# Patient Record
Sex: Female | Born: 1970 | Race: Black or African American | Hispanic: No | Marital: Single | Smoking: Never smoker
Health system: Southern US, Community
[De-identification: ages and names within clinical notes are randomized; demographics above are authoritative.]

## PROBLEM LIST (undated history)

## (undated) DIAGNOSIS — L80 Vitiligo: Secondary | ICD-10-CM

## (undated) DIAGNOSIS — D497 Neoplasm of unspecified behavior of endocrine glands and other parts of nervous system: Secondary | ICD-10-CM

## (undated) DIAGNOSIS — E237 Disorder of pituitary gland, unspecified: Secondary | ICD-10-CM

---

## 2018-07-04 ENCOUNTER — Other Ambulatory Visit: Payer: Self-pay

## 2018-07-04 ENCOUNTER — Emergency Department: Payer: Medicaid Other

## 2018-07-04 ENCOUNTER — Encounter: Payer: Self-pay | Admitting: Emergency Medicine

## 2018-07-04 DIAGNOSIS — R911 Solitary pulmonary nodule: Secondary | ICD-10-CM | POA: Insufficient documentation

## 2018-07-04 DIAGNOSIS — M546 Pain in thoracic spine: Secondary | ICD-10-CM | POA: Diagnosis not present

## 2018-07-04 DIAGNOSIS — R079 Chest pain, unspecified: Secondary | ICD-10-CM | POA: Diagnosis not present

## 2018-07-04 LAB — COMPREHENSIVE METABOLIC PANEL
ALT: 15 U/L (ref 0–44)
AST: 15 U/L (ref 15–41)
Albumin: 4 g/dL (ref 3.5–5.0)
Alkaline Phosphatase: 55 U/L (ref 38–126)
Anion gap: 5 (ref 5–15)
BILIRUBIN TOTAL: 0.6 mg/dL (ref 0.3–1.2)
BUN: 19 mg/dL (ref 6–20)
CO2: 29 mmol/L (ref 22–32)
Calcium: 9.4 mg/dL (ref 8.9–10.3)
Chloride: 107 mmol/L (ref 98–111)
Creatinine, Ser: 0.77 mg/dL (ref 0.44–1.00)
GFR calc Af Amer: >60 mL/min (ref >60)
GFR calc non Af Amer: >60 mL/min (ref >60)
Glucose, Bld: 134 mg/dL — ABNORMAL HIGH (ref 70–99)
Potassium: 3.8 mmol/L (ref 3.5–5.1)
Sodium: 141 mmol/L (ref 135–145)
Total Protein: 7.6 g/dL (ref 6.5–8.1)

## 2018-07-04 LAB — CBC
HCT: 36 % (ref 36.0–46.0)
Hemoglobin: 11.6 g/dL — ABNORMAL LOW (ref 12.0–15.0)
MCH: 29.4 pg (ref 26.0–34.0)
MCHC: 32.2 g/dL (ref 30.0–36.0)
MCV: 91.1 fL (ref 80.0–100.0)
Platelets: 249 10*3/uL (ref 150–400)
RBC: 3.95 MIL/uL (ref 3.87–5.11)
RDW: 11.9 % (ref 11.5–15.5)
WBC: 4.5 10*3/uL (ref 4.0–10.5)
nRBC: 0 % (ref 0.0–0.2)

## 2018-07-04 LAB — TROPONIN I: Troponin I: <0.03 ng/mL (ref <0.03)

## 2018-07-04 LAB — POCT PREGNANCY, URINE: PREG TEST UR: NEGATIVE

## 2018-07-04 NOTE — ED Triage Notes (Signed)
Patient with complaint of intermittent chest pain radiating to her back with shortness of breath for a year. Patient states that the pain is happening more frequently.

## 2018-07-05 ENCOUNTER — Emergency Department: Payer: Medicaid Other

## 2018-07-05 ENCOUNTER — Encounter: Payer: Self-pay | Admitting: Radiology

## 2018-07-05 ENCOUNTER — Emergency Department
Admission: EM | Admit: 2018-07-05 | Discharge: 2018-07-05 | Disposition: A | Discharge status: Home / Self Care | Payer: Medicaid Other | Attending: Emergency Medicine | Admitting: Emergency Medicine

## 2018-07-05 DIAGNOSIS — R079 Chest pain, unspecified: Secondary | ICD-10-CM

## 2018-07-05 DIAGNOSIS — R911 Solitary pulmonary nodule: Secondary | ICD-10-CM

## 2018-07-05 DIAGNOSIS — M546 Pain in thoracic spine: Secondary | ICD-10-CM

## 2018-07-05 HISTORY — DX: Disorder of pituitary gland, unspecified: E23.7

## 2018-07-05 LAB — FIBRIN DERIVATIVES D-DIMER (ARMC ONLY): Fibrin derivatives D-dimer (ARMC): 526.3 ng/mL (FEU) — ABNORMAL HIGH (ref 0.00–499.00)

## 2018-07-05 LAB — TROPONIN I: Troponin I: <0.03 ng/mL (ref <0.03)

## 2018-07-05 MED ORDER — IOHEXOL 350 MG/ML SOLN
75.0000 mL | Freq: Once | INTRAVENOUS | Status: AC | PRN
  Administered 2018-07-05: 75 mL via INTRAVENOUS

## 2018-07-05 MED ORDER — CYCLOBENZAPRINE HCL 5 MG PO TABS: ORAL_TABLET | ORAL | 0 refills | Status: DC

## 2018-07-05 MED ORDER — SODIUM CHLORIDE 0.9 % IV BOLUS
500.0000 mL | Freq: Once | INTRAVENOUS | Status: AC
  Administered 2018-07-05: 500 mL via INTRAVENOUS

## 2018-07-05 NOTE — Discharge Instructions (Addendum)
You may take muscle relaxer as needed.  Return to the ER for worsening symptoms, persistent vomiting, difficulty breathing or other concerns.

## 2018-07-05 NOTE — ED Provider Notes (Addendum)
[facility] Emergency Department Provider Note   ____________________________________________   First MD Initiated Contact with Patient 07/05/18 0403     (approximate)  I have reviewed the triage vital signs and the nursing notes.   HISTORY  Chief Complaint Chest Pain and Shortness of Breath    HPI Marilyn Flores is a 48 y.o. female who presents to the ED from home with a chief complaint of intermittent chest pain radiating to her back for the past YEAR.  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  States the pain is happening more frequently now and sometimes associated with shortness of breath.  Denies associated diaphoresis, palpitations, nausea/vomiting, dizziness.  Has not sought evaluation for it.  Denies recent fever, chills, cough, congestion, dysuria, diarrhea.  Denies recent travel, trauma or hormone use.    Past Medical History:  Diagnosis Date  . Pituitary abnormality (HCC)     There are no active problems to display for this patient.   History reviewed. No pertinent surgical history.  Prior to Admission medications   Not on File    Allergies Patient has no known allergies.  Family history None for CAD  Social History Social History   Tobacco Use  . Smoking status: Never Smoker  . Smokeless tobacco: Never Used  Substance Use Topics  . Alcohol use: Not on file  . Drug use: Not on file    Review of Systems  Constitutional: No fever/chills Eyes: No visual changes. ENT: No sore throat. Cardiovascular: Positive for chest pain. Respiratory: Denies shortness of breath. Gastrointestinal: No abdominal pain.  No nausea, no vomiting.  No diarrhea.  No constipation. Genitourinary: Negative for dysuria. Musculoskeletal: Positive for back pain. Skin: Negative for rash. Neurological: Negative for headaches, focal weakness or  numbness.   ____________________________________________   PHYSICAL EXAM:  VITAL SIGNS: ED Triage Vitals  Enc Vitals Group     BP 07/04/18 2202 126/86     Pulse Rate 07/04/18 2201 73     Resp 07/04/18 2201 18     Temp 07/04/18 2201 98.4 F (36.9 C)     Temp src --      SpO2 07/04/18 2201 98 %     Weight 07/04/18 2201 217 lb (98.4 kg)     Height 07/04/18 2201 5\' 9"  (1.753 m)     Head Circumference --      Peak Flow --      Pain Score 07/04/18 2201 7     Pain Loc --      Pain Edu? --      Excl. in [place]? --     Constitutional: Alert and oriented. Well appearing and in no acute distress. Eyes: Conjunctivae are normal. PERRL. EOMI. Head: Atraumatic. Nose: No congestion/rhinnorhea. Mouth/Throat: Mucous membranes are moist.  Oropharynx non-erythematous. Neck: No stridor.   Cardiovascular: Normal rate, regular rhythm. Grossly normal heart sounds.  Good peripheral circulation. Respiratory: Normal respiratory effort.  No retractions. Lungs CTAB.  Anterior chest tender to palpation. Gastrointestinal: Soft and nontender. No distention. No abdominal bruits. No CVA tenderness. Musculoskeletal: Thoracic back tender to palpation.  No lower extremity tenderness nor edema.  No joint effusions. Neurologic:  Normal speech and language. No gross focal neurologic deficits are appreciated. No gait instability. Skin:  Skin is warm, dry and intact. No rash noted.  Vitiligo. Psychiatric: Mood and affect are normal. Speech and behavior are normal.  ____________________________________________  LABS (all labs ordered are listed, but only abnormal results are displayed)  Labs Reviewed  CBC - Abnormal; Notable for the following components:      Result Value   Hemoglobin 11.6 (*)    All other components within normal limits  COMPREHENSIVE METABOLIC PANEL - Abnormal; Notable for the following components:   Glucose, Bld 134 (*)    All other components within normal limits  FIBRIN DERIVATIVES  D-DIMER ([facility] ONLY) - Abnormal; Notable for the following components:   Fibrin derivatives D-dimer ([facility]) 526.30 (*)    All other components within normal limits  TROPONIN I  TROPONIN I  POC URINE PREG, ED  POCT PREGNANCY, URINE   ____________________________________________  EKG  ED ECG REPORT I, Maja Mccaffery J, the attending physician, personally viewed and interpreted this ECG.   Date: 07/05/2018  EKG Time: 2207  Rate: 75  Rhythm: normal EKG, normal sinus rhythm  Axis: Normal  Intervals:none  ST&T Change: Nonspecific  ____________________________________________  RADIOLOGY  ED MD interpretation: No acute cardiopulmonary process  Official radiology report(s): Dg Chest 2 View  Result Date: 07/04/2018 CLINICAL DATA:  Chest pain. EXAM: CHEST - 2 VIEW COMPARISON:  None. FINDINGS: Cardiomediastinal silhouette is normal. No pleural effusions or focal consolidations. Trachea projects midline and there is no pneumothorax. Soft tissue planes and included osseous structures are non-suspicious. IMPRESSION: Normal chest. Electronically Signed   By: Elon Alas M.D.   On: 07/04/2018 22:38   Ct Angio Chest Pe W/cm &/or Wo Cm  Result Date: 07/05/2018 CLINICAL DATA:  Shortness of breath and chest pain EXAM: CT ANGIOGRAPHY CHEST WITH CONTRAST TECHNIQUE: Multidetector CT imaging of the chest was performed using the standard protocol during bolus administration of intravenous contrast. Multiplanar CT image reconstructions and MIPs were obtained to evaluate the vascular anatomy. CONTRAST:  70mL OMNIPAQUE IOHEXOL 350 MG/ML SOLN COMPARISON:  Chest radiograph July 04, 2018 FINDINGS: Cardiovascular: There is no demonstrable pulmonary embolus. There is no thoracic aortic aneurysm or dissection. Visualized great vessels appear unremarkable. No pericardial effusion or pericardial thickening evident. Mediastinum/Nodes: Visualized thyroid appears normal. There is no appreciable thoracic adenopathy.  There is no esophageal lesion evident. Lungs/Pleura: There is atelectatic change in the lower lung regions. There is no edema or consolidation. There is no appreciable pleural effusion or pleural thickening. On axial slice 38 series 6, there is a 3 mm nodular opacity in the anterior segment of the right upper lobe. Upper Abdomen: Visualized upper abdominal structures appear unremarkable. Musculoskeletal: There are no blastic or lytic bone lesions. No evident chest wall lesions. Review of the MIP images confirms the above findings. IMPRESSION: 1. No demonstrable pulmonary embolus. No thoracic aortic aneurysm or dissection. 2. Areas of atelectatic change bilaterally. No edema or consolidation. 3. 3 mm nodular opacity in the anterior segment right upper lobe. No follow-up needed if patient is low-risk. Non-contrast chest CT can be considered in 12 months if patient is high-risk. This recommendation follows the consensus statement: Guidelines for Management of Incidental Pulmonary Nodules Detected on CT Images: From the Fleischner Society 2017; Radiology 2017; 284:228-243. 4.  No evident thoracic adenopathy. Electronically Signed   By: Lowella Grip III M.D.   On: 07/05/2018 07:08    ____________________________________________   PROCEDURES  Procedure(s) performed: None  Procedures  Critical Care performed: No  ____________________________________________   INITIAL IMPRESSION / ASSESSMENT AND PLAN / ED COURSE  As part of my medical decision making, I reviewed the following data within the [facility] notes reviewed  and incorporated, Labs reviewed, Old chart reviewed, Radiograph reviewed  and Notes from prior ED visits    48 year old female who presents with a one-year history of intermittent chest and back pain. Differential diagnosis includes, but is not limited to, ACS, aortic dissection, pulmonary embolism, cardiac tamponade, pneumothorax, pneumonia, pericarditis,  myocarditis, GI-related causes including esophagitis/gastritis, and musculoskeletal chest wall pain.    Patient currently denies pain.  Initial EKG and troponin are unremarkable.  Will repeat timed troponin, check d-dimer and reassess.    Clinical Course as of Jul 06 711  Mon Jul 05, 2018  0645 Noted mildly elevated d-dimer.  Will proceed with CTA chest to evaluate for PE.     [JS]  0712 Updated patient on repeat negative troponin and CT imaging results.  We discussed incidental finding of lung nodule which will be followed up as an outpatient.  Strict return precautions given.  Patient verbalizes understanding and agrees with plan of care.   [JS]    Clinical Course User Index [JS] Paulette Blanch, MD     ____________________________________________   FINAL CLINICAL IMPRESSION(S) / ED DIAGNOSES  Final diagnoses:  Nonspecific chest pain  Acute midline thoracic back pain  Lung nodule     ED Discharge Orders    None       Note:  This document was prepared using Dragon voice recognition software and may include unintentional dictation errors.    Paulette Blanch, MD 07/05/18 4210    Paulette Blanch, MD 07/05/18 [phone]

## 2018-07-05 NOTE — ED Notes (Signed)
Pt c/o of pain that starts in the back and moves circumferentially to the chest for over a year now, pt unable to ID patterns or conditions that cause the pain, at it's worst point 9/10 sharp, att 0/10, pt only medical hx is vitiligo, denies cardiac hx, drug abuse or significant family med hx

## 2018-07-05 NOTE — ED Notes (Signed)
Patient transported to CT 

## 2019-07-21 ENCOUNTER — Encounter: Payer: Self-pay | Admitting: Emergency Medicine

## 2019-07-21 ENCOUNTER — Other Ambulatory Visit: Payer: Self-pay

## 2019-07-21 ENCOUNTER — Ambulatory Visit
Admission: EM | Admit: 2019-07-21 | Discharge: 2019-07-21 | Disposition: A | Discharge status: Home / Self Care | Payer: Self-pay | Attending: Urgent Care | Admitting: Urgent Care

## 2019-07-21 DIAGNOSIS — N926 Irregular menstruation, unspecified: Secondary | ICD-10-CM | POA: Insufficient documentation

## 2019-07-21 DIAGNOSIS — Z111 Encounter for screening for respiratory tuberculosis: Secondary | ICD-10-CM | POA: Insufficient documentation

## 2019-07-21 DIAGNOSIS — R102 Pelvic and perineal pain: Secondary | ICD-10-CM | POA: Insufficient documentation

## 2019-07-21 HISTORY — DX: Neoplasm of unspecified behavior of endocrine glands and other parts of nervous system: D49.7

## 2019-07-21 HISTORY — DX: Vitiligo: L80

## 2019-07-21 LAB — URINALYSIS, COMPLETE (UACMP) WITH MICROSCOPIC
Bilirubin Urine: NEGATIVE
Glucose, UA: NEGATIVE mg/dL
Ketones, ur: NEGATIVE mg/dL
Leukocytes,Ua: NEGATIVE
Nitrite: NEGATIVE
Specific Gravity, Urine: >1.03 — ABNORMAL HIGH (ref 1.005–1.030)
pH: 5 (ref 5.0–8.0)

## 2019-07-21 LAB — PREGNANCY, URINE: Preg Test, Ur: NEGATIVE

## 2019-07-21 NOTE — Discharge Instructions (Signed)
It was very nice seeing you today in clinic. Thank you for entrusting me with your care.   Monitor symptoms. If you develop worsening pain, nausea/vomiting, or fever you will need to proceed to the emergency department for further evaluation.  Make arrangements to establish care with a primary care provider and OB/GYN for routine health maintenance and ongoing management of your pain. If your symptoms/condition worsens, please seek follow up care either here or in the ER. Please remember, our [place] providers are "right here with you" when you need [place].   Again, it was my pleasure to take care of you today. Thank you for choosing our clinic. I hope that you start to feel better quickly.   Honor Loh, MSN, APRN, FNP-C, CEN Advanced Practice Provider [facility]

## 2019-07-21 NOTE — ED Triage Notes (Addendum)
Patient c/o RLQ pain that started 3 days ago. She denies N/V/D and denies urinary symptoms. She is requesting a urine preg and states she will try and follow up with GYN for imaging.

## 2019-07-21 NOTE — ED Provider Notes (Signed)
[place], [place]   Name: Marilyn Flores DOB: 12-Jan-1971 MRN: [id number] CSN: [id number] PCP: Patient, No Pcp Per  Arrival date and time:  07/21/19 1014  Chief Complaint:  Abdominal Pain   NOTE: Prior to seeing the patient today, I have reviewed the triage nursing documentation and vital signs. Clinical staff has updated patient's PMH/PSHx, current medication list, and drug allergies/intolerances to ensure comprehensive history available to assist in medical decision making.   History:   HPI: Marilyn Flores is a 49 y.o. female who presents today with complaints of pelvic pain that has been intermittent over the course of the last 3 days. Patient advising that pain started in the RIGHT lower back initially, moving into the flank and groin over the last few days. Patient describes the pain as a "deep ache". She denies that she has experienced any nausea, vomiting, diarrhea, abdominal pain, fever, or chills. She is eating and drinking well. She has had any urinary symptoms; no dysuria, frequency, urgency, or gross hematuria. Patient denies any vaginal bleeding or discharge. She does not have a PMH of recurrent urinary tract infections or urolithiasis. Patient advising that the pain seems to be more in the pelvis than the abdomen. No vaginal bleeding or discharge. She denies a history of known ovarian cysts. Patient presents today requesting a urine pregnancy test and a pap smear. LMP unknown as patient has irregular menses due to a known pituitary tumor. Patient has no PCP or local OB/GYN.   Additionally, patient recently had a pre-employment TST placed to her LEFT forearm. She has documentation of the date/locaiton placed. Patient requesting for test to be read today while in clinic.   Past Medical History:  Diagnosis Date  . Pituitary tumor   . Vitiligo     History reviewed. No pertinent surgical history.  History reviewed. No pertinent family history.  Social History   Tobacco Use  .  Smoking status: Never Smoker  . Smokeless tobacco: Never Used  Substance Use Topics  . Alcohol use: Yes  . Drug use: Never    There are no problems to display for this patient.   Home Medications:    No outpatient medications have been marked as taking for the 07/21/19 encounter [facility] Encounter).    Allergies:   Patient has no known allergies.  Review of Systems (ROS): Review of Systems  Constitutional: Negative for chills and fever.  Respiratory: Negative for cough and shortness of breath.   Cardiovascular: Negative for chest pain and palpitations.  Gastrointestinal: Negative for abdominal pain, diarrhea, nausea and vomiting.  Endocrine:       Known pituitary tumor.  Genitourinary: Positive for flank pain (resolved), menstrual problem (irregular) and pelvic pain (RIGHT). Negative for decreased urine volume, dysuria, frequency, hematuria, urgency, vaginal bleeding, vaginal discharge and vaginal pain.  Musculoskeletal: Positive for back pain (resolved).  Skin: Negative for color change, pallor and rash.  Neurological: Negative for dizziness, syncope, weakness and headaches.  All other systems reviewed and are negative.    Vital Signs: Today's Vitals   07/21/19 1035 07/21/19 1037 07/21/19 1126  BP:  (!) 129/96   Pulse:  82   Resp:  18   Temp:  98.5 F (36.9 C)   TempSrc:  Oral   SpO2:  99%   Weight: 200 lb (90.7 kg)    Height: 5\' 9"  (1.753 m)    PainSc: 8   8     Physical Exam: Physical Exam  Constitutional: She is oriented to person, place, and  time and well-developed, well-nourished, and in no distress.  HENT:  Head: Normocephalic and atraumatic.  Eyes: Pupils are equal, round, and reactive to light.  Cardiovascular: Normal rate, regular rhythm, normal heart sounds and intact distal pulses.  Pulmonary/Chest: Effort normal and breath sounds normal.  Abdominal: Soft. Normal appearance and bowel sounds are normal. She exhibits no distension. There is no  abdominal tenderness. There is no CVA tenderness.  Genitourinary:    Genitourinary Comments: Exam deferred as pain is intermittent. No bleeding or dicharge. Patient is not currently pregnant.   Neurological: She is alert and oriented to person, place, and time. Gait normal.  Skin: Skin is warm and dry. No rash noted. She is not diaphoretic.  Psychiatric: Mood, memory, affect and judgment normal.  Nursing note and vitals reviewed.   Urgent Care Treatments / Results:   Orders Placed This Encounter  Procedures  . US PELVIC COMPLETE WITH TRANSVAGINAL  . Urinalysis, Complete w Microscopic  . Pregnancy, urine    LABS: PLEASE NOTE: all labs that were ordered this encounter are listed, however only abnormal results are displayed. Labs Reviewed  URINALYSIS, COMPLETE (UACMP) WITH MICROSCOPIC - Abnormal; Notable for the following components:      Result Value   Specific Gravity, Urine >1.030 (*)    Hgb urine dipstick SMALL (*)    Protein, ur TRACE (*)    Bacteria, UA FEW (*)    All other components within normal limits  PREGNANCY, URINE    EKG: -None  RADIOLOGY: No results found.  PROCEDURES: Procedures  MEDICATIONS RECEIVED THIS VISIT: Medications - No data to display  PERTINENT CLINICAL COURSE NOTES/UPDATES:   Initial Impression / Assessment and Plan / Urgent Care Course:  Pertinent labs & imaging results that were available during my care of the patient were personally reviewed by me and considered in my medical decision making (see lab/imaging section of note for values and interpretations).  Marilyn Flores is a 49 y.o. female who presents to [facility] today with complaints of Abdominal Pain  Patient is well appearing overall in clinic today. She does not appear to be in any acute distress. Presenting symptoms (see HPI) and exam as documented above. Symptoms intermittent over the last 3 days. No associated nausea, vomiting, fevers/chills, or urinary symptoms.  Menses irregular due to pituitary tumor. Discussed need for further evaluation via [place] and/or CT imaging. Due to inclimate weather, neither imaging modality is available in the urgent care setting today. I encouraged patient to consider further evaluation in the emergency department today, however she declined. Discussed having imaging performed on an outpatient basis at the hospital today, however she declined this option as well. Patient lives in close proximity to [employer], she is asking to return tomorrow to have imaging performed. Given the intermittent nature of her pain, coupled with the absence of systemic symptoms, this is felt to be a reasonable request. In efforts to ensure that patient's pain is adequately evaluated, will place orders for outpatient US imaging here (next available appointment).   In the interim, will pursue workup and treatment as follows:   UA today negative for infection; 0-5 WBC/hpf, 0-5 RBC/hpf, few bacteria; no nitrites or LE.   Urine HCG (-) for pregnancy.   Request for pap smear unable to be obliged today. Discussed need to establish care with PCP and/or GYN for this testing.    TST read by me and interpreted as NEGATIVE (0 mm induration with no erythema); documentation completed.  Results reviewed with patient. Reviewed DDx. Patient to RTC on 07/22/19 at 1600 for transvaginal pelvic US.  Patient advised to use APAP and/or IBU as needed for pain. Discussed at length the need for patient to establish both primary care and OB/GYN care. Offered to refer patient to [facility] in order to assist with getting her in to be seen. Patient declines citing that she has immediate plans to move out of state in the next month or so. Discussed that findings from [place] may warrant further evaluation by a GYN, thus I recommended that she contact the [facility] Department for assistance in getting in with a provider there; verbalized understanding. I will plan  on contacting the patient with [place] results once study has been interpreted by radiologist; plans reviewed with patient.   I have reviewed the follow up and strict return precautions for any new or worsening symptoms. Patient is aware of symptoms that would be deemed urgent/emergent, and would thus require further evaluation in the emergency department. At the time of discharge, she verbalized understanding and consent with the discharge plan as it was reviewed with her. All questions were fielded by provider and/or clinic staff prior to patient discharge.    Final Clinical Impressions / Urgent Care Diagnoses:   Final diagnoses:  Pelvic pain in female  Irregular menses  Encounter for PPD skin test reading    New Prescriptions:  [place] Controlled Substance Registry consulted? Not Applicable  No orders of the defined types were placed in this encounter.   Recommended Follow up Care:  Patient encouraged to follow up with the following provider within the specified time frame, or sooner as dictated by the severity of her symptoms. As always, she was instructed that for any urgent/emergent care needs, she should seek care either here or in the emergency department for more immediate evaluation.  Follow-up Information    PCP.   Why: You need to establish primary care services for routine health maintaince and follow up.       [facility].   Specialty: Emergency Medicine Why: As needed for worsening symptoms. Contact information: [place] [id number] [place] On 07/22/2019.   Why: You have an ultrasound scheduled TOMORROW (07/22/2019) at 4:00 PM       Call  [facility].   Why: Need to establish care with OB/GYN. The health department can offer assistance. Contact information: [place]          NOTE: This note was prepared using Lobbyist along with smaller phrase technology. Despite my best ability to proofread, there is the potential that transcriptional errors may still occur from this process, and are completely unintentional.    Karen Kitchens, NP 07/21/19 2323

## 2019-07-22 ENCOUNTER — Ambulatory Visit: Payer: Self-pay

## 2020-03-11 IMAGING — CR DG CHEST 2V
1 series · 2 of 2 positions shown · non-contrast
Comparison: None.

CLINICAL DATA: Chest pain.

EXAM:
CHEST - 2 VIEW

[Series 1: dg chest 2 view · 0.14mm/px · 2 of 2 slices shown]
[im 1/2]
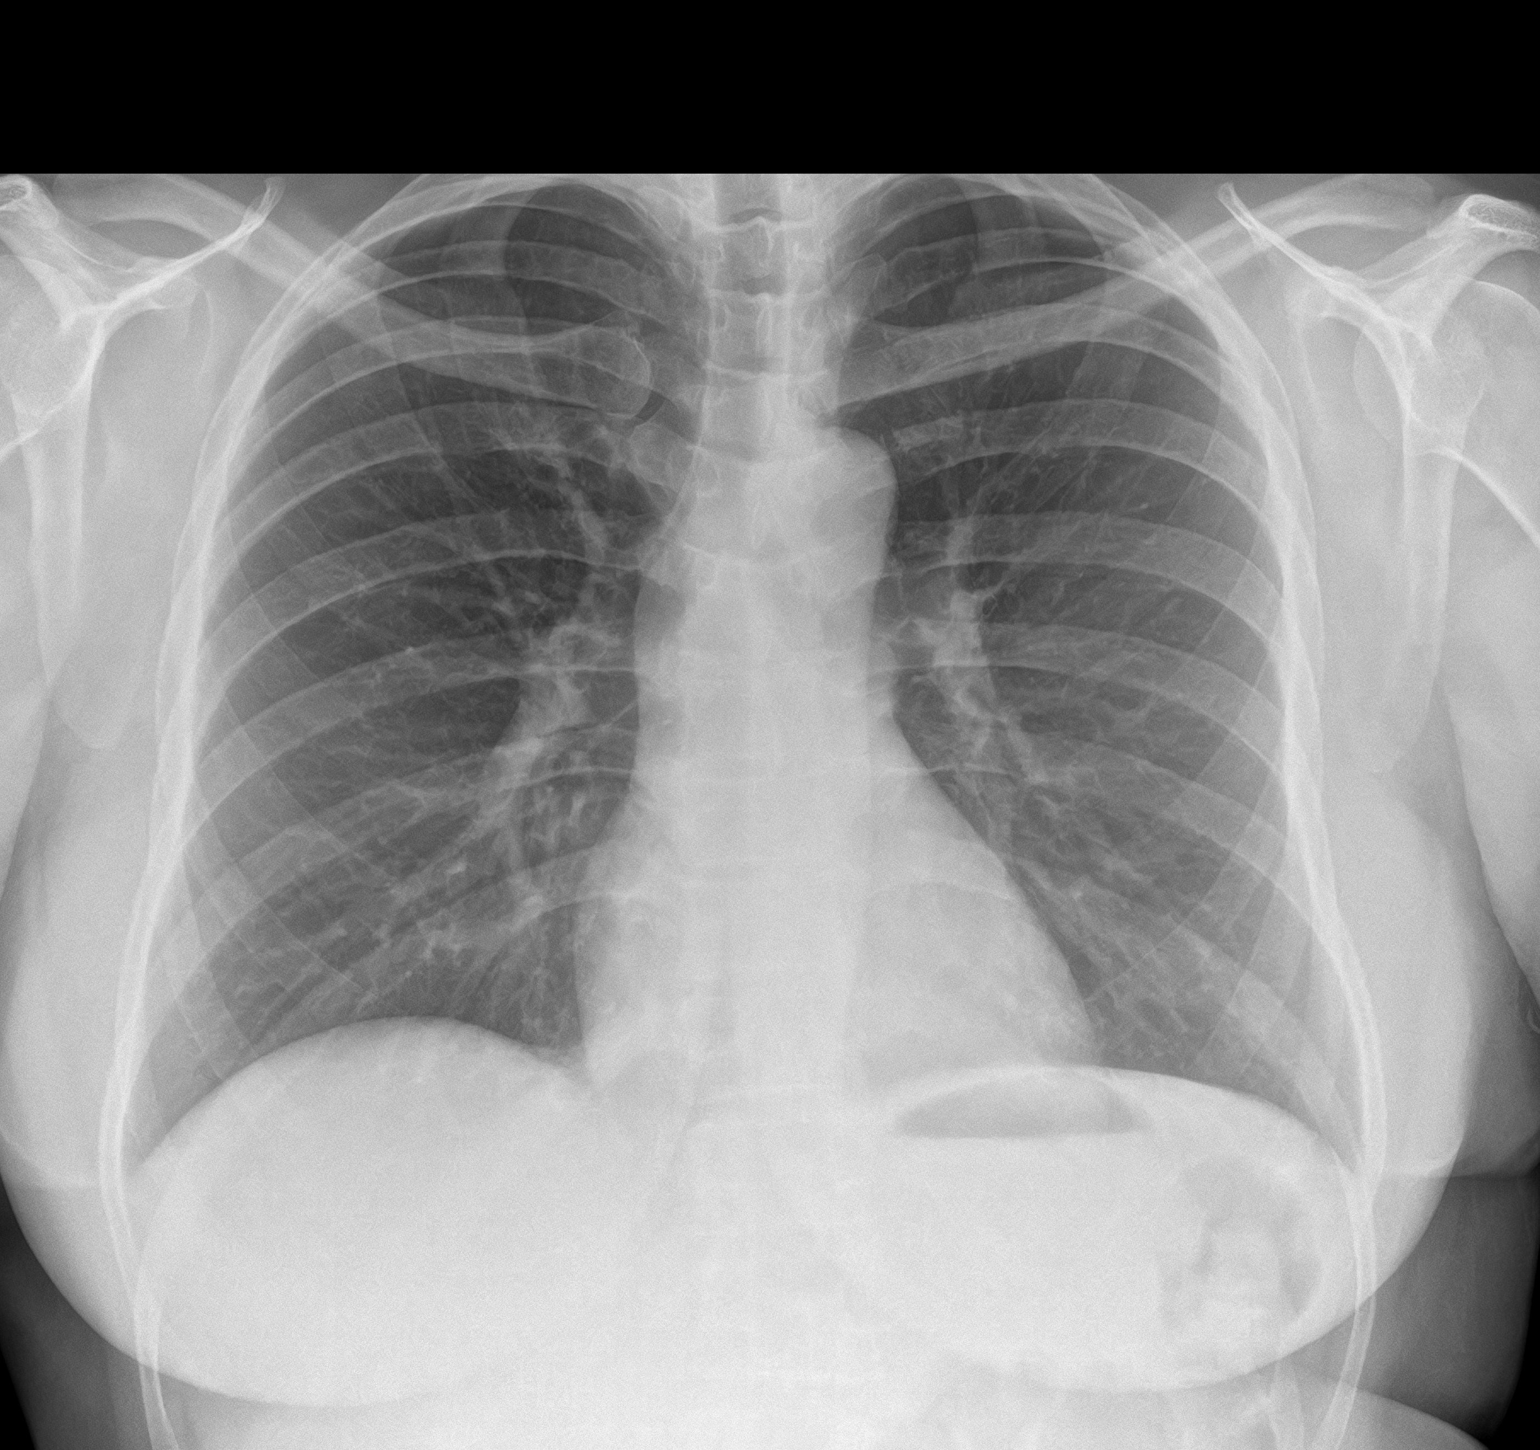
[im 2/2]
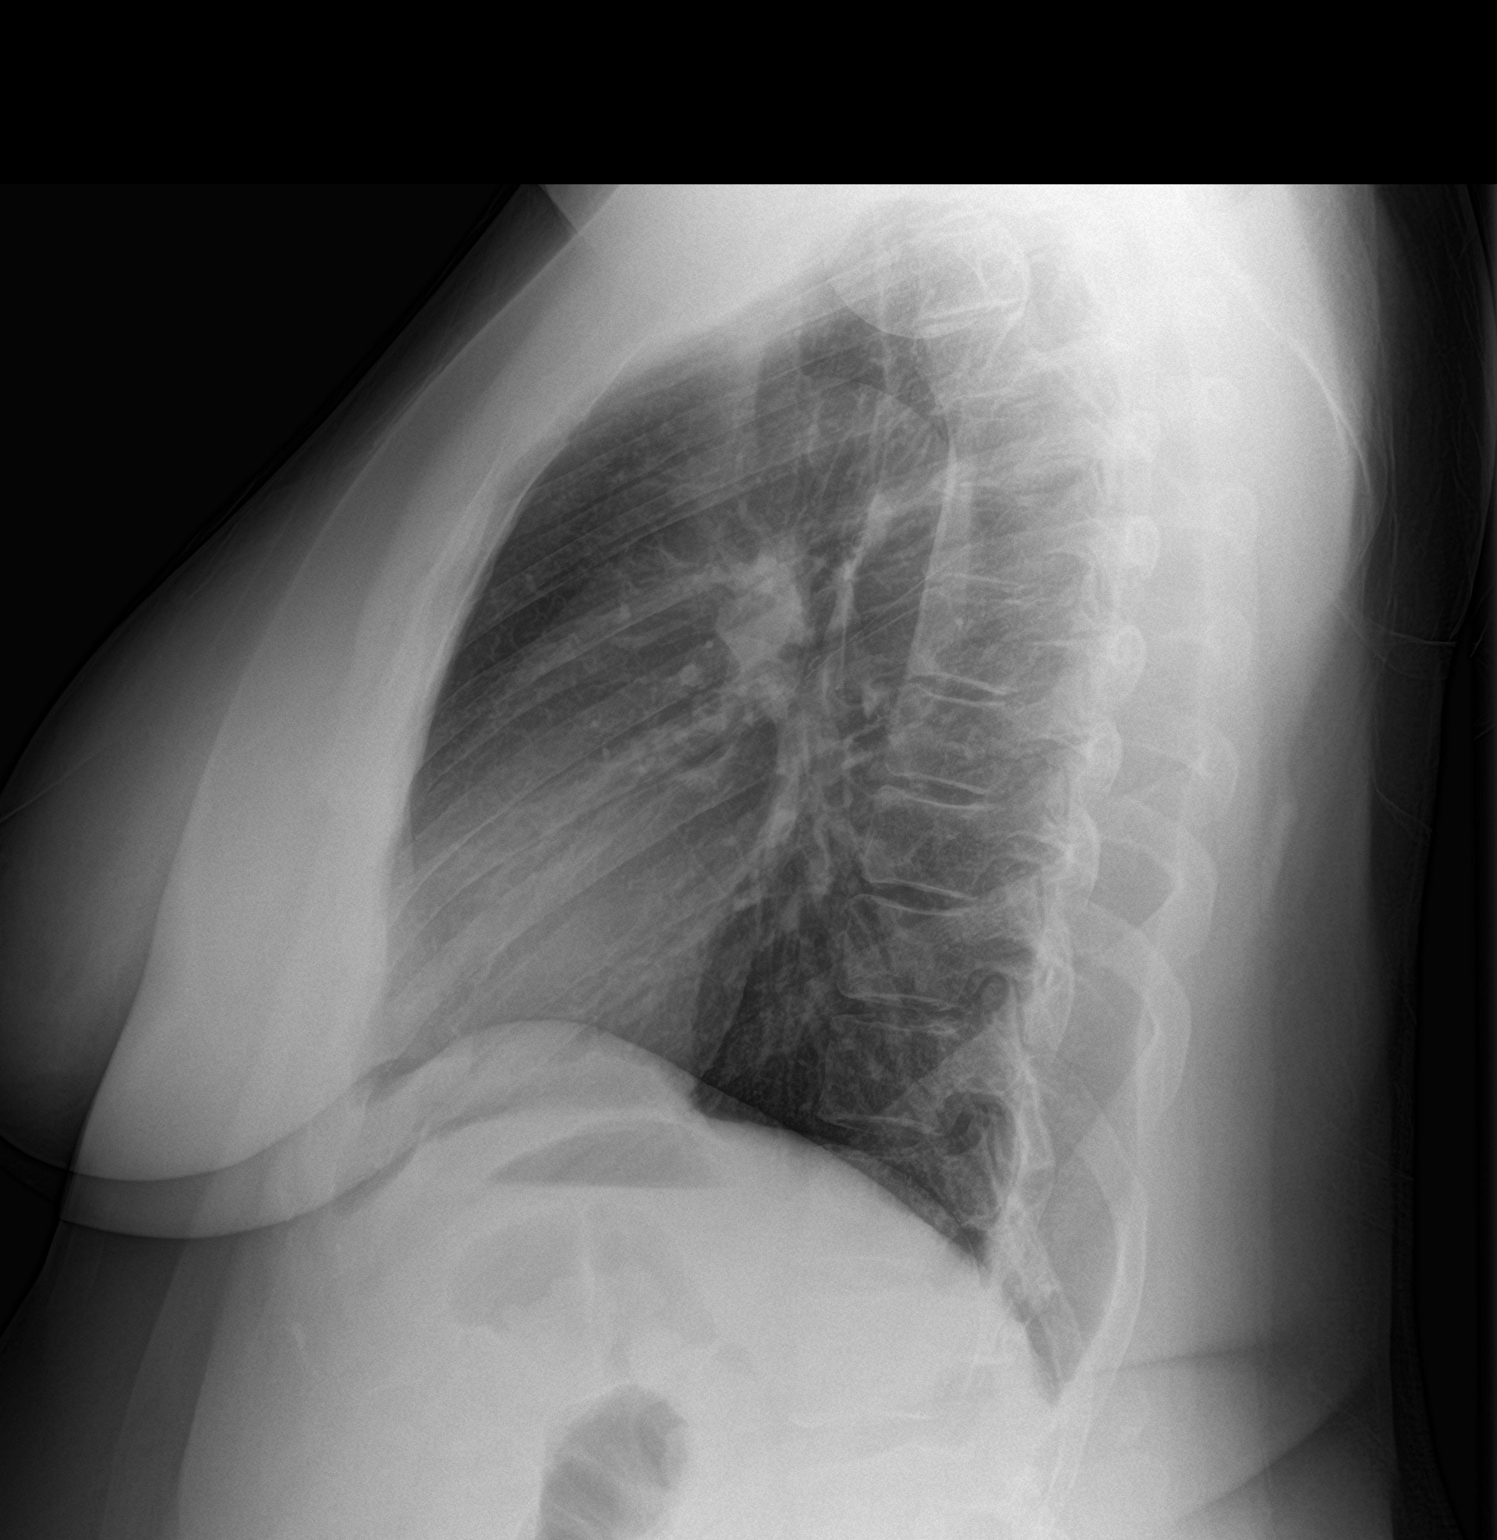

[2 of 2 positions shown; findings below may reference images not displayed]

FINDINGS: Cardiomediastinal silhouette is normal. No pleural effusions or
focal consolidations. Trachea projects midline and there is no
pneumothorax. Soft tissue planes and included osseous structures are
non-suspicious.
IMPRESSION: Normal chest.

## 2020-03-12 IMAGING — CT CT ANGIO CHEST
2 of 6 series · 18 of 46 positions shown · IV contrast (APPLIED)
Comparison: Chest radiograph July 04, 2018

CLINICAL DATA: Shortness of breath and chest pain

EXAM:
CT ANGIOGRAPHY CHEST WITH CONTRAST
TECHNIQUE: Multidetector CT imaging of the chest was performed using the
standard protocol during bolus administration of intravenous
contrast. Multiplanar CT image reconstructions and MIPs were
obtained to evaluate the vascular anatomy.
CONTRAST:  75mL OMNIPAQUE IOHEXOL 350 MG/ML SOLN

[Series 5: thins · axial · 0.69mm/px · z∈[-292,-82]mm · 15 of 230 slices shown]
[im 10/230  lung]
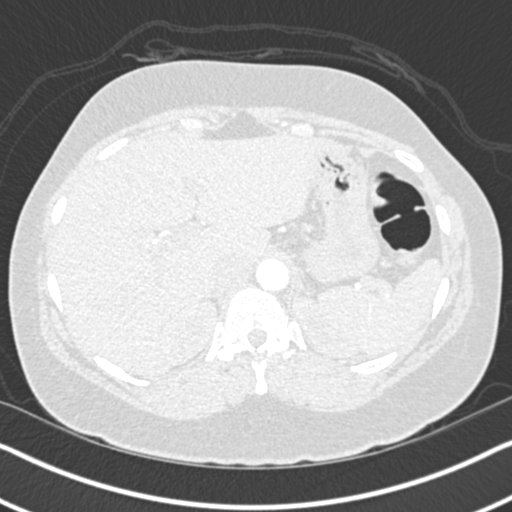
[im 30/230  soft-tissue]
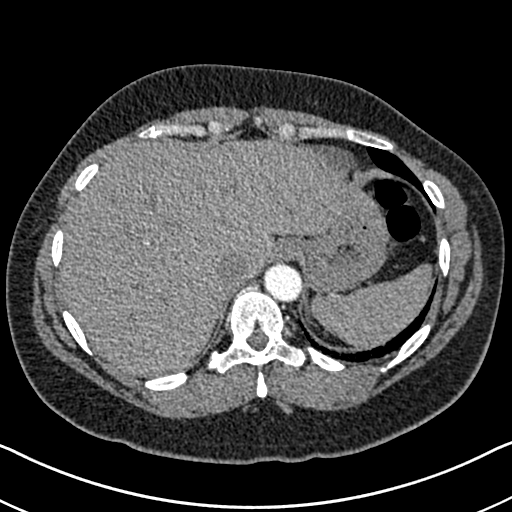
[im 40/230  lung]
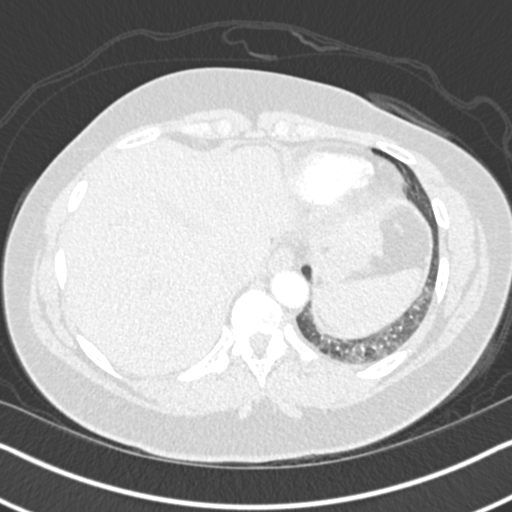
[im 60/230  soft-tissue]
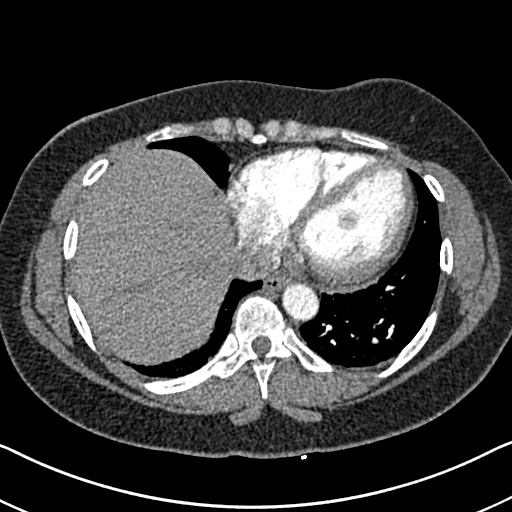
[im 70/230  lung]
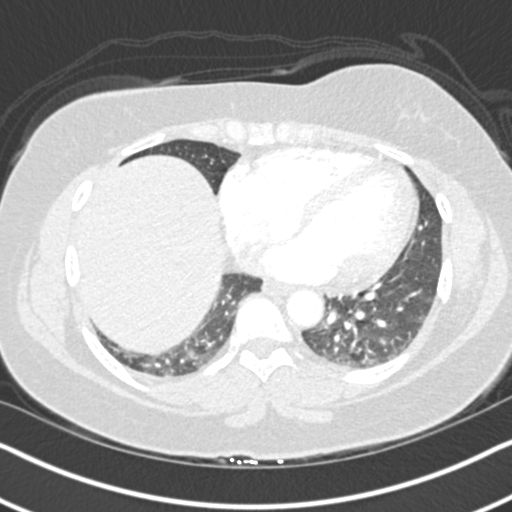
[im 90/230  soft-tissue]
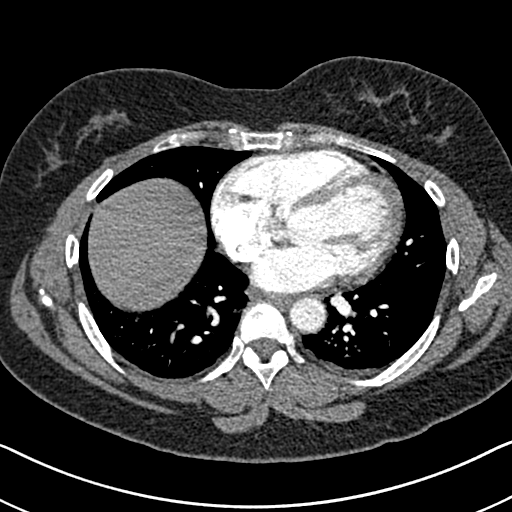
[im 100/230  lung]
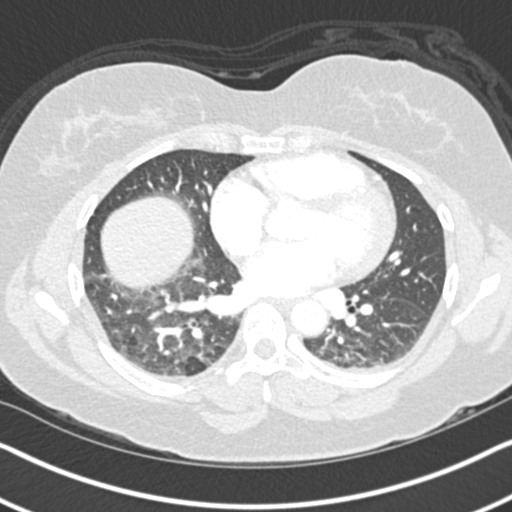
[im 120/230  soft-tissue]
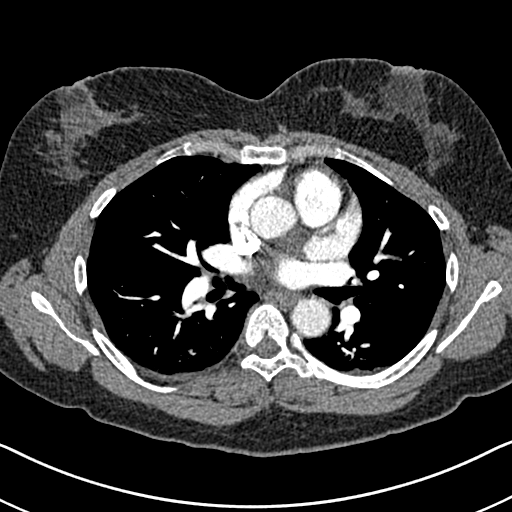
[im 130/230  lung]
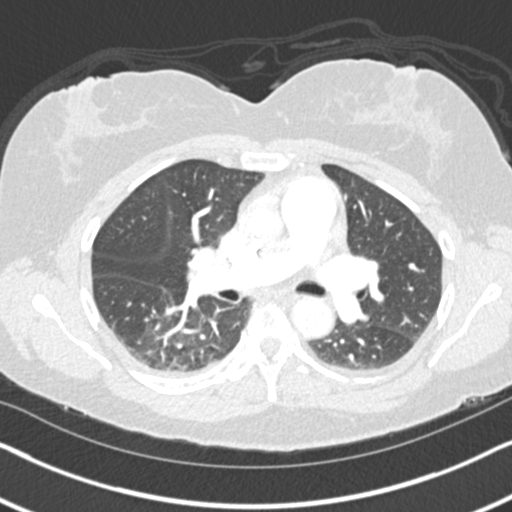
[im 140/230  soft-tissue]
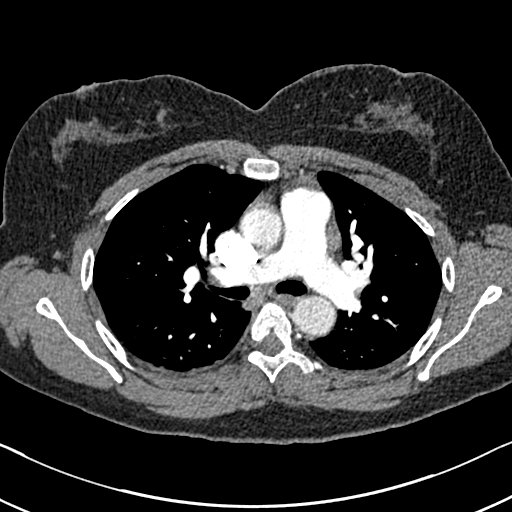
[im 160/230  lung]
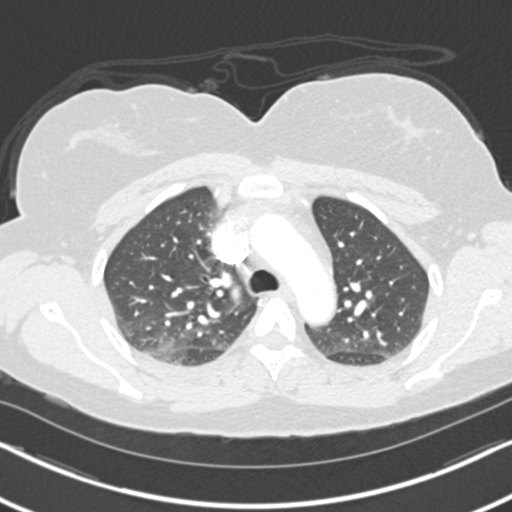
[im 170/230  soft-tissue]
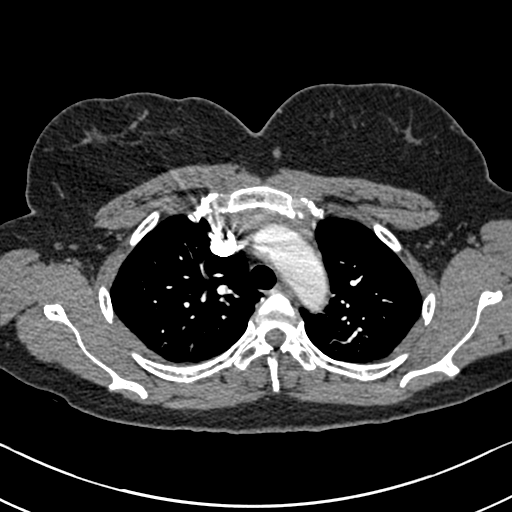
[im 190/230  lung]
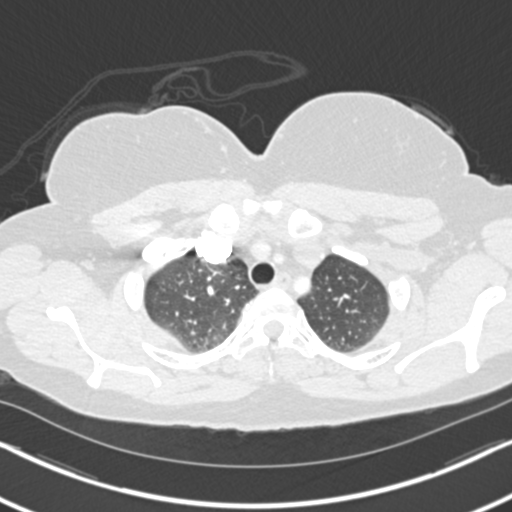
[im 200/230  soft-tissue]
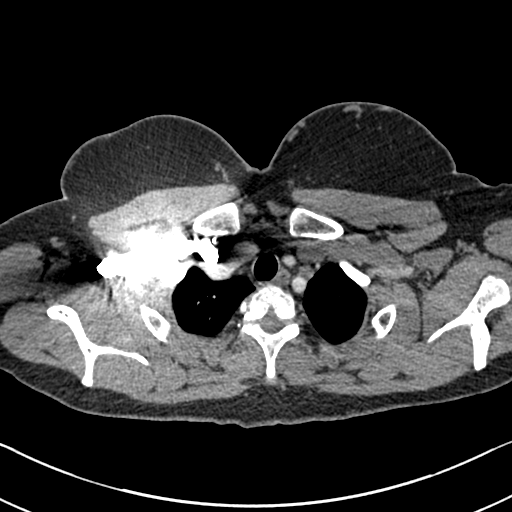
[im 220/230  lung]
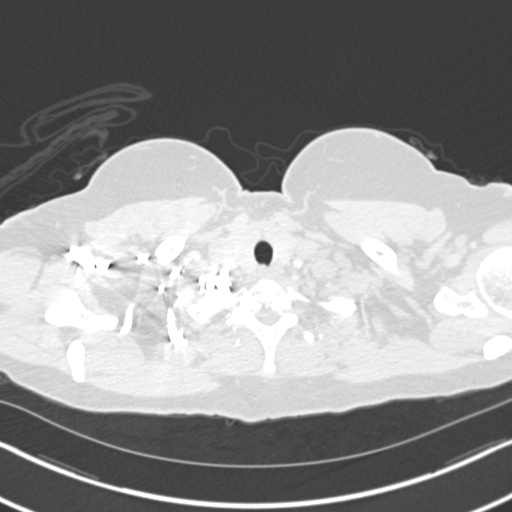

[Series 7: coronal mpr · coronal · 0.49mm/px · 3 of 87 slices shown]
[im 22/87  soft-tissue]
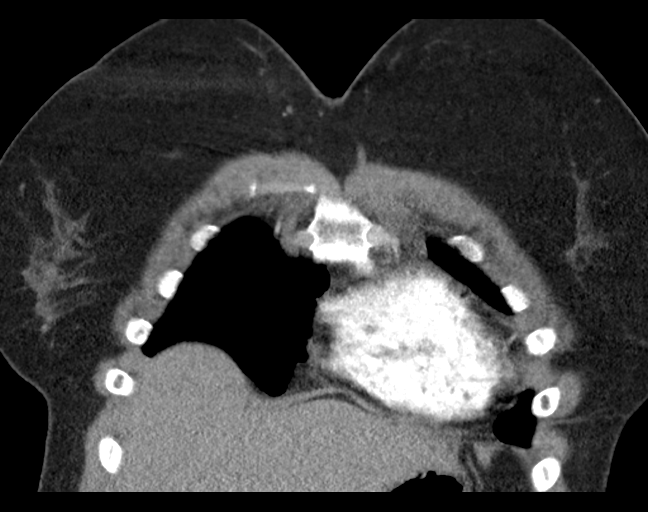
[im 44/87  soft-tissue]
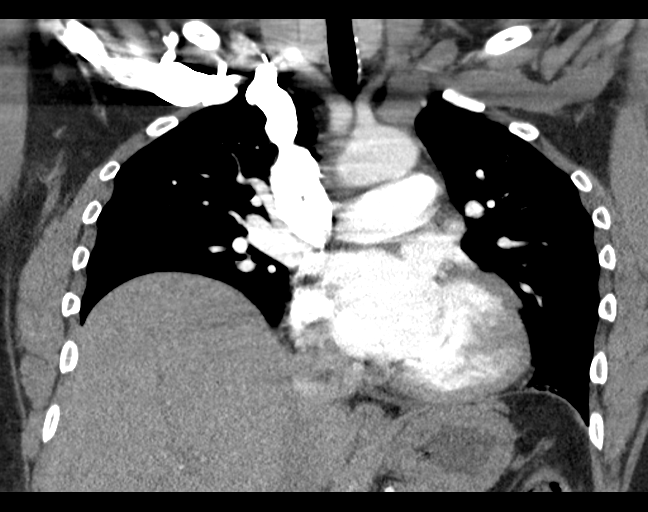
[im 65/87  soft-tissue]
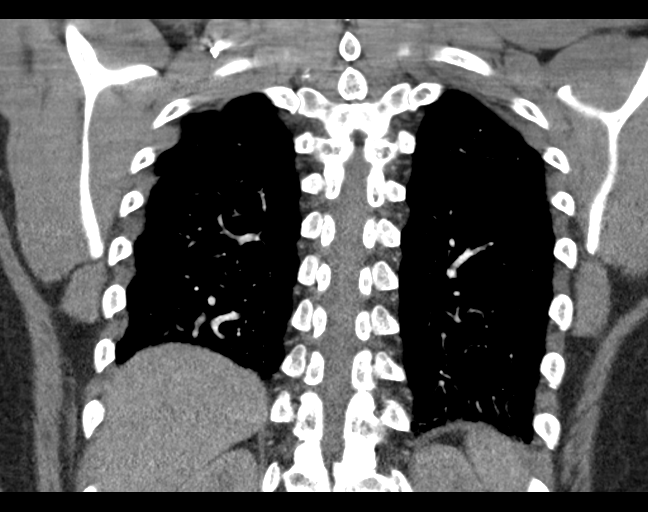

[18 of 46 positions shown; findings below may reference images not displayed]

FINDINGS: Cardiovascular: There is no demonstrable pulmonary embolus. There is
no thoracic aortic aneurysm or dissection. Visualized great vessels
appear unremarkable. No pericardial effusion or pericardial
thickening evident.

Mediastinum/Nodes: Visualized thyroid appears normal. There is no
appreciable thoracic adenopathy. There is no esophageal lesion
evident.

Lungs/Pleura: There is atelectatic change in the lower lung regions.
There is no edema or consolidation. There is no appreciable pleural
effusion or pleural thickening.

On axial slice 38 series 6, there is a 3 mm nodular opacity in the
anterior segment of the right upper lobe.

Upper Abdomen: Visualized upper abdominal structures appear
unremarkable.

Musculoskeletal: There are no blastic or lytic bone lesions. No
evident chest wall lesions.

Review of the MIP images confirms the above findings.
IMPRESSION: 1. No demonstrable pulmonary embolus. No thoracic aortic aneurysm or
dissection.

2. Areas of atelectatic change bilaterally. No edema or
consolidation.

3. 3 mm nodular opacity in the anterior segment right upper lobe. No
follow-up needed if patient is low-risk. Non-contrast chest CT can
be considered in 12 months if patient is high-risk. This
recommendation follows the consensus statement: Guidelines for
Management of Incidental Pulmonary Nodules Detected on CT Images:

4.  No evident thoracic adenopathy.
